# Patient Record
Sex: Female | Born: 1950 | Race: Black or African American | Hispanic: No | Marital: Married | Smoking: Never smoker
Health system: Southern US, Community
[De-identification: ages and names within clinical notes are randomized; demographics above are authoritative.]

## PROBLEM LIST (undated history)

## (undated) DIAGNOSIS — E119 Type 2 diabetes mellitus without complications: Secondary | ICD-10-CM

## (undated) DIAGNOSIS — I10 Essential (primary) hypertension: Secondary | ICD-10-CM

## (undated) HISTORY — PX: TOOTH EXTRACTION: SUR596

## (undated) HISTORY — PX: ABDOMINAL HYSTERECTOMY: SHX81

## (undated) HISTORY — DX: Essential (primary) hypertension: I10

## (undated) HISTORY — DX: Type 2 diabetes mellitus without complications: E11.9

---

## 2013-06-01 ENCOUNTER — Ambulatory Visit (INDEPENDENT_AMBULATORY_CARE_PROVIDER_SITE_OTHER): Payer: BC Managed Care – PPO | Admitting: Family Medicine

## 2013-06-01 VITALS — BP 120/72 | HR 42 | Temp 98.0°F | Resp 16 | Ht 67.0 in | Wt 165.0 lb

## 2013-06-01 DIAGNOSIS — R002 Palpitations: Secondary | ICD-10-CM
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
## 2013-06-01 NOTE — Patient Instructions (Signed)
Your EKG looks ok- you did have a couple of early beats but nothing of concern.  Take care

## 2013-06-23 ENCOUNTER — Encounter: Payer: Self-pay | Admitting: Family Medicine

## 2020-11-08 ENCOUNTER — Emergency Department (HOSPITAL_BASED_OUTPATIENT_CLINIC_OR_DEPARTMENT_OTHER): Payer: No Typology Code available for payment source

## 2020-11-08 ENCOUNTER — Other Ambulatory Visit: Payer: Self-pay

## 2020-11-08 ENCOUNTER — Encounter (HOSPITAL_BASED_OUTPATIENT_CLINIC_OR_DEPARTMENT_OTHER): Payer: Self-pay | Admitting: *Deleted

## 2020-11-08 ENCOUNTER — Emergency Department (HOSPITAL_BASED_OUTPATIENT_CLINIC_OR_DEPARTMENT_OTHER)
Admission: EM | Admit: 2020-11-08 | Discharge: 2020-11-08 | Disposition: A | Discharge status: Home / Self Care | Payer: No Typology Code available for payment source | Attending: Emergency Medicine | Admitting: Emergency Medicine

## 2020-11-08 DIAGNOSIS — S199XXA Unspecified injury of neck, initial encounter: Secondary | ICD-10-CM | POA: Diagnosis present

## 2020-11-08 DIAGNOSIS — Z7982 Long term (current) use of aspirin: Secondary | ICD-10-CM | POA: Insufficient documentation

## 2020-11-08 DIAGNOSIS — Z7984 Long term (current) use of oral hypoglycemic drugs: Secondary | ICD-10-CM | POA: Diagnosis not present

## 2020-11-08 DIAGNOSIS — S161XXA Strain of muscle, fascia and tendon at neck level, initial encounter: Secondary | ICD-10-CM | POA: Insufficient documentation

## 2020-11-08 DIAGNOSIS — I10 Essential (primary) hypertension: Secondary | ICD-10-CM | POA: Diagnosis not present

## 2020-11-08 DIAGNOSIS — E119 Type 2 diabetes mellitus without complications: Secondary | ICD-10-CM | POA: Diagnosis not present

## 2020-11-08 DIAGNOSIS — Y92410 Unspecified street and highway as the place of occurrence of the external cause: Secondary | ICD-10-CM | POA: Insufficient documentation

## 2020-11-08 DIAGNOSIS — S39012A Strain of muscle, fascia and tendon of lower back, initial encounter: Secondary | ICD-10-CM | POA: Diagnosis not present

## 2020-11-08 DIAGNOSIS — Z79899 Other long term (current) drug therapy: Secondary | ICD-10-CM | POA: Insufficient documentation

## 2020-11-08 MED ORDER — CYCLOBENZAPRINE HCL 10 MG PO TABS: 10.0000 mg | ORAL_TABLET | Freq: Two times a day (BID) | ORAL | 0 refills | Status: AC | PRN

## 2020-11-08 MED ORDER — ACETAMINOPHEN 500 MG PO TABS
1000.0000 mg | ORAL_TABLET | Freq: Once | ORAL | Status: AC
  Administered 2020-11-08: 1000 mg via ORAL
  Filled 2020-11-08: qty 2

## 2020-11-08 MED ORDER — IBUPROFEN 400 MG PO TABS
400.0000 mg | ORAL_TABLET | Freq: Once | ORAL | Status: AC
  Administered 2020-11-08: 400 mg via ORAL
  Filled 2020-11-08: qty 1
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
## 2023-07-22 ENCOUNTER — Emergency Department (HOSPITAL_BASED_OUTPATIENT_CLINIC_OR_DEPARTMENT_OTHER)
Admission: EM | Admit: 2023-07-22 | Discharge: 2023-07-22 | Disposition: A | Discharge status: Home / Self Care | Payer: Medicare HMO | Attending: Emergency Medicine | Admitting: Emergency Medicine

## 2023-07-22 ENCOUNTER — Encounter (HOSPITAL_BASED_OUTPATIENT_CLINIC_OR_DEPARTMENT_OTHER): Payer: Self-pay | Admitting: Emergency Medicine

## 2023-07-22 ENCOUNTER — Other Ambulatory Visit: Payer: Self-pay

## 2023-07-22 DIAGNOSIS — K649 Unspecified hemorrhoids: Secondary | ICD-10-CM | POA: Insufficient documentation

## 2023-07-22 DIAGNOSIS — E119 Type 2 diabetes mellitus without complications: Secondary | ICD-10-CM | POA: Insufficient documentation

## 2023-07-22 DIAGNOSIS — E876 Hypokalemia: Secondary | ICD-10-CM | POA: Insufficient documentation

## 2023-07-22 DIAGNOSIS — I10 Essential (primary) hypertension: Secondary | ICD-10-CM | POA: Diagnosis not present

## 2023-07-22 DIAGNOSIS — K5900 Constipation, unspecified: Secondary | ICD-10-CM | POA: Diagnosis not present

## 2023-07-22 DIAGNOSIS — K625 Hemorrhage of anus and rectum: Secondary | ICD-10-CM | POA: Insufficient documentation

## 2023-07-22 LAB — CBC WITH DIFFERENTIAL/PLATELET
Abs Immature Granulocytes: 0.03 10*3/uL (ref 0.00–0.07)
Basophils Absolute: 0 10*3/uL (ref 0.0–0.1)
Basophils Relative: 0 %
Eosinophils Absolute: 0 10*3/uL (ref 0.0–0.5)
Eosinophils Relative: 0 %
HCT: 33 % — ABNORMAL LOW (ref 36.0–46.0)
Hemoglobin: 10.7 g/dL — ABNORMAL LOW (ref 12.0–15.0)
Immature Granulocytes: 0 %
Lymphocytes Relative: 9 %
Lymphs Abs: 1 10*3/uL (ref 0.7–4.0)
MCH: 26.2 pg (ref 26.0–34.0)
MCHC: 32.4 g/dL (ref 30.0–36.0)
MCV: 80.9 fL (ref 80.0–100.0)
Monocytes Absolute: 0.7 10*3/uL (ref 0.1–1.0)
Monocytes Relative: 6 %
Neutro Abs: 10.1 10*3/uL — ABNORMAL HIGH (ref 1.7–7.7)
Neutrophils Relative %: 85 %
Platelets: 226 10*3/uL (ref 150–400)
RBC: 4.08 MIL/uL (ref 3.87–5.11)
RDW: 14 % (ref 11.5–15.5)
WBC: 11.9 10*3/uL — ABNORMAL HIGH (ref 4.0–10.5)
nRBC: 0 % (ref 0.0–0.2)

## 2023-07-22 LAB — BASIC METABOLIC PANEL
Anion gap: 11 (ref 5–15)
BUN: 15 mg/dL (ref 8–23)
CO2: 24 mmol/L (ref 22–32)
Calcium: 9.6 mg/dL (ref 8.9–10.3)
Chloride: 100 mmol/L (ref 98–111)
Creatinine, Ser: 0.88 mg/dL (ref 0.44–1.00)
GFR, Estimated: >60 mL/min (ref >60)
Glucose, Bld: 272 mg/dL — ABNORMAL HIGH (ref 70–99)
Potassium: 3.3 mmol/L — ABNORMAL LOW (ref 3.5–5.1)
Sodium: 135 mmol/L (ref 135–145)

## 2023-07-22 MED ORDER — POLYETHYLENE GLYCOL 3350 17 GM/SCOOP PO POWD: 1.0000 | Freq: Once | ORAL | 0 refills | Status: AC

## 2023-07-22 MED ORDER — SENNOSIDES-DOCUSATE SODIUM 8.6-50 MG PO TABS: 1.0000 | ORAL_TABLET | Freq: Every evening | ORAL | 0 refills | Status: AC | PRN

## 2023-07-22 MED ORDER — HYDROCORTISONE (PERIANAL) 2.5 % EX CREA: 1.0000 | TOPICAL_CREAM | Freq: Two times a day (BID) | CUTANEOUS | 0 refills | Status: AC

## 2023-07-22 NOTE — ED Provider Notes (Signed)
Emergency Department Provider Note   I have reviewed the triage vital signs and the nursing notes.   HISTORY  Chief Complaint Rectal Bleeding   HPI Felicia Dunlap is a 73 y.o. female past history of diabetes and hypertension presents to the emergency department with bright red blood after manual disimpaction at home.  Patient has been struggling with constipation for several days.  She disimpacted herself, which she is done in the past, and had success with large bowel movements both at home and since arrival to the emergency department.  During the disimpaction at home she had some bright red blood on her hand.  Heavy bleeding has not continued.  She had some spotting when wiping today in the ED after a second bowel movement.  She is not having abdominal or severe rectal pain.  She does have a history of hemorrhoids.  Notes some mild fatigue.  She is not on anticoagulation.  No fevers.   Past Medical History:  Diagnosis Date   Diabetes mellitus without complication (HCC)    Hypertension     Review of Systems  Constitutional: No fever/chills Cardiovascular: Denies chest pain. Respiratory: Denies shortness of breath. Gastrointestinal: No abdominal pain.  No nausea, no vomiting.  No diarrhea. Positive constipation. Positive BRBPR.  Genitourinary: Negative for dysuria. Musculoskeletal: Negative for back pain. Skin: Negative for rash. Neurological: Negative for headaches.  ____________________________________________   PHYSICAL EXAM:  VITAL SIGNS: Vitals:   07/22/23 1440  BP: (!) 152/79  Pulse: 82  Resp: 18  Temp: 97.8 F (36.6 C)  SpO2: 99%    Constitutional: Alert and oriented. Well appearing and in no acute distress. Eyes: Conjunctivae are normal.  Head: Atraumatic. Nose: No congestion/rhinnorhea. Mouth/Throat: Mucous membranes are moist.  Oropharynx non-erythematous. Neck: No stridor.   Cardiovascular: Normal rate, regular rhythm. Good peripheral circulation.  Grossly normal heart sounds.   Respiratory: Normal respiratory effort.  No retractions. Lungs CTAB. Gastrointestinal: Soft and nontender. No distention.  Rectal: Rectal exam performed after obtaining patient's verbal consent.  Nurse tech chaperone at bedside during the exam.  No fissure or anal tearing.  No cellulitis or abscess.  I do appreciate a nonthrombosed hemorrhoid at the 6 o'clock position. No active bleeding.  Musculoskeletal: No gross deformities of extremities. Neurologic:  Normal speech and language. Skin:  Skin is warm, dry and intact. No rash noted.  ____________________________________________   LABS (all labs ordered are listed, but only abnormal results are displayed)  Labs Reviewed  BASIC METABOLIC PANEL - Abnormal; Notable for the following components:      Result Value   Potassium 3.3 (*)    Glucose, Bld 272 (*)    All other components within normal limits  CBC WITH DIFFERENTIAL/PLATELET - Abnormal; Notable for the following components:   WBC 11.9 (*)    Hemoglobin 10.7 (*)    HCT 33.0 (*)    Neutro Abs 10.1 (*)    All other components within normal limits   ____________________________________________   PROCEDURES  Procedure(s) performed:   Procedures  None  ____________________________________________   INITIAL IMPRESSION / ASSESSMENT AND PLAN / ED COURSE  Pertinent labs & imaging results that were available during my care of the patient were reviewed by me and considered in my medical decision making (see chart for details).   This patient is Presenting for Evaluation of rectal bleeding, which does require a range of treatment options, and is a complaint that involves a moderate risk of morbidity and mortality.  The Differential Diagnoses  include constipation, trauma, colitis, hemorrhoids, etc.  I did obtain Additional Historical Information from husband at bedside.   Clinical Laboratory Tests Ordered, included BMP with minimal hypokalemia. No  AKI. Mild anemia.  Medical Decision Making: Summary:  Patient presents emergency department with rectal bleeding at home after disimpaction.  She has had several large bowel movements with minimal bleeding since.  No pain or tenderness on exam.  Overall suspicion for intra-abdominal process suggesting infection or need for surgery is very low.  Considered abdominal imaging but defer in the setting of reassuring exam and vitals.  She does have an external hemorrhoid on exam and no evidence of fissure.  Reevaluation with update and discussion with patient.  Labs are reassuring.  Plan for Anusol and constipation medicine at home.  Advise close PCP follow-up with strict ED return precautions should abdominal pain/fever develop or if heavy bleeding returns.  Patient's presentation is most consistent with acute, uncomplicated illness.   Disposition: discharge  ____________________________________________  FINAL CLINICAL IMPRESSION(S) / ED DIAGNOSES  Final diagnoses:  Rectal bleeding  Constipation, unspecified constipation type  Hemorrhoids, unspecified hemorrhoid type     NEW OUTPATIENT MEDICATIONS STARTED DURING THIS VISIT:  New Prescriptions   HYDROCORTISONE (ANUSOL-HC) 2.5 % RECTAL CREAM    Place 1 Application rectally 2 (two) times daily.   POLYETHYLENE GLYCOL POWDER (MIRALAX) 17 GM/SCOOP POWDER    Take 255 g by mouth once for 1 dose.   SENNA-DOCUSATE (SENOKOT-S) 8.6-50 MG TABLET    Take 1 tablet by mouth at bedtime as needed for mild constipation or moderate constipation.    Note:  This document was prepared using Dragon voice recognition software and may include unintentional dictation errors.  Alona Bene, MD, [facility] Emergency Medicine    Calvin Jablonowski, Arlyss Repress, MD 07/22/23 [phone]

## 2023-07-22 NOTE — Discharge Instructions (Signed)
You were seen in the emergency department today for constipation.  We recommend that you use one or more of the following over-the-counter medications in the order described:   1)  Colace (or Dulcolax) 100 mg:  This is a stool softener, and you may take it once or twice a day as needed. 2)  Senna tablets:  This is a bowel stimulant that will help "push" out your stool. It is the next step to add after you have tried a stool softener. 3)  Miralax (powder):  This medication works by drawing additional fluid into your intestines and helps to flush out your stool.  Mix the powder with water or juice according to label instructions.  It may help if the Colace and Senna are not sufficient, but you must be sure to use the recommended amount of water or juice when you mix up the powder. Remember that narcotic pain medications are constipating, so avoid them or minimize their use.  Drink plenty of fluids.  Please return to the Emergency Department immediately if you develop new or worsening symptoms that concern you, such as (but not limited to) fever > 101 degrees, severe abdominal pain, or persistent vomiting.

## 2023-07-22 NOTE — ED Triage Notes (Signed)
Pt POV steady gait-  Pt c/o constipation this AM, tried to digitally disimpact at home, saw bleeding.  Reports successful BM after, hx of hemorrhoids.
